# Patient Record
Sex: Male | Born: 2000 | Race: White | Hispanic: Yes | Marital: Single | Smoking: Current some day smoker
Health system: Southern US, Community
[De-identification: ages and names within clinical notes are randomized; demographics above are authoritative.]

## PROBLEM LIST (undated history)

## (undated) DIAGNOSIS — A048 Other specified bacterial intestinal infections: Secondary | ICD-10-CM

## (undated) DIAGNOSIS — J45909 Unspecified asthma, uncomplicated: Secondary | ICD-10-CM

## (undated) HISTORY — PX: FRACTURE SURGERY: SHX138

## (undated) HISTORY — DX: Other specified bacterial intestinal infections: A04.8

## (undated) HISTORY — PX: TONSILLECTOMY: SUR1361

---
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
## 2020-11-07 ENCOUNTER — Encounter (HOSPITAL_COMMUNITY): Payer: Self-pay

## 2020-11-07 ENCOUNTER — Emergency Department (HOSPITAL_COMMUNITY)
Admission: EM | Admit: 2020-11-07 | Discharge: 2020-11-07 | Disposition: A | Discharge status: Home / Self Care | Payer: Federal, State, Local not specified - PPO | Attending: Emergency Medicine | Admitting: Emergency Medicine

## 2020-11-07 ENCOUNTER — Other Ambulatory Visit: Payer: Self-pay

## 2020-11-07 DIAGNOSIS — S59912A Unspecified injury of left forearm, initial encounter: Secondary | ICD-10-CM | POA: Diagnosis present

## 2020-11-07 DIAGNOSIS — J4520 Mild intermittent asthma, uncomplicated: Secondary | ICD-10-CM | POA: Diagnosis not present

## 2020-11-07 DIAGNOSIS — X789XXA Intentional self-harm by unspecified sharp object, initial encounter: Secondary | ICD-10-CM | POA: Insufficient documentation

## 2020-11-07 DIAGNOSIS — F17210 Nicotine dependence, cigarettes, uncomplicated: Secondary | ICD-10-CM | POA: Insufficient documentation

## 2020-11-07 DIAGNOSIS — S51812A Laceration without foreign body of left forearm, initial encounter: Secondary | ICD-10-CM | POA: Insufficient documentation

## 2020-11-07 MED ORDER — BACITRACIN ZINC 500 UNIT/GM EX OINT
TOPICAL_OINTMENT | Freq: Two times a day (BID) | CUTANEOUS | Status: DC
  Administered 2020-11-07: 1 via TOPICAL
  Filled 2020-11-07: qty 0.9

## 2020-11-07 MED ORDER — IBUPROFEN 200 MG PO TABS
400.0000 mg | ORAL_TABLET | Freq: Once | ORAL | Status: AC
  Administered 2020-11-07: 400 mg via ORAL
  Filled 2020-11-07: qty 2

## 2020-11-07 MED ORDER — LIDOCAINE-EPINEPHRINE (PF) 2 %-1:200000 IJ SOLN
10.0000 mL | Freq: Once | INTRAMUSCULAR | Status: AC
  Administered 2020-11-07: 10 mL
  Filled 2020-11-07: qty 20

## 2020-11-07 NOTE — ED Triage Notes (Signed)
Pt states about 1 hour ago he cut his left forearm. Pt reports this is a coping mechanism for him. He currently denies SI or HI. Reports heavy drinking last night.

## 2020-11-07 NOTE — Discharge Instructions (Signed)
Place vasoline on the wound and keep covered for the next 48 hours.  Then only cover if it will become contaminated.

## 2020-11-07 NOTE — ED Provider Notes (Signed)
[facility]-EMERGENCY DEPT Provider Note   CSN: [id number] Arrival date & time: 11/07/20  0813     History Chief Complaint  Patient presents with   Laceration    Jack Aguirre is a 20 y.o. male.  Patient is a 20 year old male with a history of asthma as well has prior stress and history of self-mutilation who is presenting today with a laceration to his left forearm.  Patient reports an the last few months he has had significant increase his stress and has started to cut again.  This occurred while he was in high school and he had a counselor at the time and had been doing well.  However once he turned 16 he can no longer see that counselor and was seeing someone for a while but was not a good fit but in the meantime had gotten healthy and felt like he did not need anybody any longer.  However more recently he has had a lot more stress in his life.  He has been drinking a little more than he feels like he should but denies any drug use.  He takes no medications regularly.  He denies any suicidal ideation and reports this was by no means a way to kill himself.  However he does admit that he cut too deeply today which is the first time that has ever happened.  No numbness or tingling of his hand.  Pain to the site only with palpation.  No other areas of injury.  The history is provided by the patient.  Laceration     Past Medical History:  Diagnosis Date   Asthma    H. 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 pylori infection 11/18/2014   Dyspepsia 11/14/2014    Past Surgical History:  Procedure Laterality Date   FRACTURE SURGERY     TONSILLECTOMY         Family History  Problem Relation Age of Onset   Heart disease Mother    Gestational diabetes Mother    Heart disease Father    Diabetes Maternal Grandmother    Heart disease Maternal Grandfather     Social  History   Tobacco Use   Smoking status: Some Days   Smokeless tobacco: Current  Substance Use Topics   Alcohol use: No   Drug use: No    Home Medications Prior to Admission medications   Medication Sig Start Date End Date Taking? Authorizing Provider  albuterol (PROVENTIL HFA;VENTOLIN HFA) 108 (90 BASE) MCG/ACT inhaler Inhale 2 puffs into the lungs every 6 (six) hours as needed. For shortness of breath or 30 minutes prior to sports activities. 01/30/15   Dettinger, Elige Radon, MD  ibuprofen (ADVIL,MOTRIN) 200 MG tablet Take 200 mg by mouth every 6 (six) hours as needed. For pain    [provider]  oseltamivir (TAMIFLU) 75 MG capsule Take 1 capsule (75 mg total) by mouth 2 (two) times daily. 03/30/16   Elenora Gamma, MD  predniSONE (DELTASONE) 20 MG tablet 2 po at same time daily for 5 days 03/30/16   Elenora Gamma, MD    Allergies    Patient has no known allergies.  Review of Systems   Review of Systems  All other systems reviewed and are negative.  Physical Exam Updated Vital Signs BP (!) 135/103 (BP Location: Right Arm)   Pulse (!) 117   Temp  98.5 F (36.9 C) (Oral)   Resp 19   SpO2 99%   Physical Exam Vitals and nursing note reviewed.  Constitutional:      General: He is not in acute distress.    Appearance: Normal appearance. He is normal weight.  HENT:     Head: Normocephalic.     Mouth/Throat:     Mouth: Mucous membranes are moist.  Eyes:     Pupils: Pupils are equal, round, and reactive to light.  Cardiovascular:     Rate and Rhythm: Normal rate.     Pulses: Normal pulses.  Pulmonary:     Effort: Pulmonary effort is normal.  Musculoskeletal:        General: Signs of injury present.       Arms:     Cervical back: Normal range of motion and neck supple.     Comments:  normal flexion and extension of the left wrist.  Handgrip 5 out of 5 strength.  Normal sensation in all 5 fingers.  Skin:    General: Skin is warm and dry.  Neurological:      Mental Status: He is alert and oriented to person, place, and time. Mental status is at baseline.     Sensory: No sensory deficit.     Motor: No weakness.  Psychiatric:     Comments: Self mutilation and poor coping mechanisms.  Denies any SI or HI.  No hallucinations, delusions or paranoia.    ED Results / Procedures / Treatments   Labs (all labs ordered are listed, but only abnormal results are displayed) Labs Reviewed - No data to display  EKG None  Radiology No results found.  Procedures Procedures  LACERATION REPAIR Performed by: [facility] Authorized by: Gwyneth Sprout Consent: Verbal consent obtained. Risks and benefits: risks, benefits and alternatives were discussed Consent given by: patient Patient identity confirmed: provided demographic data Prepped and Draped in normal sterile fashion Wound explored  Laceration Location: left forearm  Laceration Length: 6cm  No Foreign Bodies seen or palpated  Anesthesia: local infiltration  Local anesthetic: lidocaine 2% with epinephrine  Anesthetic total: 9 ml  Irrigation method: syringe Amount of cleaning: standard  Skin closure: 4.0 vicryl rapide  Number of sutures: 24  Technique: running and simple interrupted  Patient tolerance: Patient tolerated the procedure well with no immediate complications.   Medications Ordered in ED Medications  lidocaine-EPINEPHrine (XYLOCAINE W/EPI) 2 %-1:200000 (PF) injection 10 mL (10 mLs Infiltration Given 11/07/20 4332)    ED Course  I have reviewed the triage vital signs and the nursing notes.  Pertinent labs & imaging results that were available during my care of the patient were reviewed by me and considered in my medical decision making (see chart for details).    MDM Rules/Calculators/A&P                           Patient presenting today after a self-inflicted laceration to the left wrist.  Injury repaired as above.  Tetanus shot is up-to-date at this  time.  Patient recognizes that he cut too deep and this was not a desire to kill himself but just to relieve some stress and anxiety he has been experiencing as he is under a lot more stress recently.  This occurred approximately 1 hour prior to arrival.  Patient has had counselors in the past but has not seen anyone for a while.  He is interested in outpatient resources.  At  this time do not feel that patient is suicidal.  No evidence of tendon injury.  Final Clinical Impression(s) / ED Diagnoses Final diagnoses:  Laceration of left forearm, initial encounter    Rx / DC Orders ED Discharge Orders     None        Gwyneth Sprout, MD 11/07/20 [phone]

## 2023-01-10 ENCOUNTER — Ambulatory Visit: Payer: Federal, State, Local not specified - PPO

## 2023-01-11 ENCOUNTER — Ambulatory Visit: Payer: Federal, State, Local not specified - PPO | Admitting: Nurse Practitioner

## 2023-01-11 NOTE — Progress Notes (Deleted)
   New Patient Office Visit  Subjective    Patient ID: Jack Aguirre, male    DOB: January 03, 2001  Age: 22 y.o. MRN: [id number]  CC: No chief complaint on file.   HPI Jack Aguirre presents to establish care ***  Outpatient Encounter Medications as of 01/11/2023  Medication Sig   albuterol (PROVENTIL HFA;VENTOLIN HFA) 108 (90 BASE) MCG/ACT inhaler Inhale 2 puffs into the lungs every 6 (six) hours as needed. For shortness of breath or 30 minutes prior to sports activities.   ibuprofen (ADVIL,MOTRIN) 200 MG tablet Take 200 mg by mouth every 6 (six) hours as needed. For pain   oseltamivir (TAMIFLU) 75 MG capsule Take 1 capsule (75 mg total) by mouth 2 (two) times daily.   predniSONE (DELTASONE) 20 MG tablet 2 po at same time daily for 5 days   No facility-administered encounter medications on file as of 01/11/2023.    Past Medical History:  Diagnosis Date   Asthma    H. pylori infection     Past Surgical History:  Procedure Laterality Date   FRACTURE SURGERY     TONSILLECTOMY      Family History  Problem Relation Age of Onset   Heart disease Mother    Gestational diabetes Mother    Heart disease Father    Diabetes Maternal Grandmother    Heart disease Maternal Grandfather     Social History   Socioeconomic History   Marital status: Single    Spouse name: Not on file   Number of children: Not on file   Years of education: Not on file   Highest education level: Not on file  Occupational History   Not on file  Tobacco Use   Smoking status: Some Days   Smokeless tobacco: Current  Substance and Sexual Activity   Alcohol use: No   Drug use: No   Sexual activity: Not on file  Other Topics Concern   Not on file  Social History Narrative   ** Merged History Encounter **       Social Determinants of Health   Financial Resource Strain: Not on file  Food Insecurity: Not on file  Transportation Needs: Not on file  Physical Activity: Not on  file  Stress: Not on file  Social Connections: Not on file  Intimate Partner Violence: Not on file    ROS Negative unless indicated in HPI   Objective    There were no vitals taken for this visit.  Physical Exam        Assessment & Plan:  Routine medical exam  Continue healthy lifestyle choices, including diet (rich in fruits, vegetables, and lean proteins, and low in salt and simple carbohydrates) and exercise (at least 30 minutes of moderate physical activity daily).     The above assessment and management plan was discussed with the patient. The patient verbalized understanding of and has agreed to the management plan. Patient is aware to call the clinic if they develop any new symptoms or if symptoms persist or worsen. Patient is aware when to return to the clinic for a follow-up visit. Patient educated on when it is appropriate to go to the emergency department.   No follow-ups on file.   Arrie Aran Santa Lighter, DNP [facility] [address] [place], [place] [address] [phone]

## 2024-01-10 ENCOUNTER — Encounter: Payer: Self-pay | Admitting: Family Medicine

## 2024-01-10 ENCOUNTER — Ambulatory Visit: Payer: Self-pay | Admitting: Family Medicine

## 2024-01-10 VITALS — BP 129/83 | HR 96 | Temp 97.8°F | Ht 68.0 in | Wt 201.2 lb

## 2024-01-10 DIAGNOSIS — J45990 Exercise induced bronchospasm: Secondary | ICD-10-CM | POA: Diagnosis not present

## 2024-01-10 DIAGNOSIS — R41840 Attention and concentration deficit: Secondary | ICD-10-CM | POA: Diagnosis not present

## 2024-01-10 DIAGNOSIS — F411 Generalized anxiety disorder: Secondary | ICD-10-CM

## 2024-01-10 DIAGNOSIS — F331 Major depressive disorder, recurrent, moderate: Secondary | ICD-10-CM

## 2024-01-10 MED ORDER — SERTRALINE HCL 50 MG PO TABS: 50.0000 mg | ORAL_TABLET | Freq: Every morning | ORAL | 2 refills | Status: DC

## 2024-01-10 MED ORDER — SERTRALINE HCL 50 MG PO TABS: 75.0000 mg | ORAL_TABLET | Freq: Every morning | ORAL | 1 refills | Status: AC

## 2024-01-10 MED ORDER — ALBUTEROL SULFATE HFA 108 (90 BASE) MCG/ACT IN AERS: 2.0000 | INHALATION_SPRAY | Freq: Four times a day (QID) | RESPIRATORY_TRACT | 11 refills | Status: AC | PRN

## 2024-01-10 NOTE — Progress Notes (Signed)
 Subjective:  Patient ID: Jack Aguirre, male    DOB: 2000/03/29, 23 y.o.   MRN: [id number]  Patient Care Team: Severa Rock HERO, FNP as PCP - General (Family Medicine) Harl Jayson CROME, MD (Inactive) (Family Medicine)   Chief Complaint:  Establish Care   HPI: Jack Aguirre is a 22 y.o. male presenting on 01/10/2024 for Establish Care   Jack Aguirre is a 23 year old male who presents to establish care and manage anxiety and depression.  Mood disturbance and anxiety - Longstanding symptoms of anxiety and depression, initially treated with Vistaril and Prozac prior to high school (approximately ten years ago) - Restarted pharmacologic treatment in July after resuming therapy; currently taking Zoloft for mixed anxiety and depressive symptoms - Attends therapy sessions every two weeks - Current low dose of Zoloft provides adequate symptom control  Attention deficit symptoms - History of attention deficit symptoms since childhood, diagnosed with ADHD in the past - Brief trial of Wellbutrin, discontinued due to lack of efficacy and adverse effects - Attention deficits worsened during college, particularly with remote learning during the COVID-19 pandemic - 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, finds these tasks challenging due to attention issues  Exercise-induced dyspnea and asthma - Experiences significant shortness of breath and sweating during physical exertion, especially during sexual intercourse - Symptoms typically resolve within five to ten minutes, usually after use of an inhaler - No personal inhaler currently available; seeking prescription for one  Laboratory findings - Comprehensive blood workup at [facility] outpatient rehab center was normal except for a non-fasting glucose level - Uncertain if hormone profile was included in prior laboratory evaluation         01/10/2024    2:36 PM 03/30/2016     8:44 AM 08/29/2015    3:20 PM  Depression screen PHQ 2/9  Decreased Interest 0 0 0  Down, Depressed, Hopeless 0 0 0  PHQ - 2 Score 0 0 0  Altered sleeping 0 0   Tired, decreased energy 1 0   Change in appetite 0 0   Feeling bad or failure about yourself  0 0   Trouble concentrating 0 0   Moving slowly or fidgety/restless 0 0   Suicidal thoughts 0 0    PHQ-9 Score 1 0    Difficult doing work/chores Somewhat difficult       Data saved with a previous flowsheet row definition      01/10/2024    2:36 PM  GAD 7 : Generalized Anxiety Score  Nervous, Anxious, on Edge 0  Control/stop worrying 0  Worry too much - different things 0  Trouble relaxing 0  Restless 0  Easily annoyed or irritable 0  Afraid - awful might happen 0  Total GAD 7 Score 0  Anxiety Difficulty Not difficult at all        Relevant past medical, surgical, family, and social history reviewed and updated as indicated.  Allergies and medications reviewed and updated. Data reviewed: Chart in Epic.   Past Medical History:  Diagnosis Date   Asthma    H. pylori infection     Past Surgical History:  Procedure Laterality Date   FRACTURE SURGERY     nose x2   TONSILLECTOMY      Social History   Socioeconomic History   Marital status: Single    Spouse name: Not on file   Number of children: Not on file   Years  of education: Not on file   Highest education level: Not on file  Occupational History   Not on file  Tobacco Use   Smoking status: Some Days    Types: Cigars   Smokeless tobacco: Never  Vaping Use   Vaping status: Every Day   Substances: Nicotine  Substance and Sexual Activity   Alcohol use: Yes    Comment: socially   Drug use: No   Sexual activity: Not on file  Other Topics Concern   Not on file  Social History Narrative   ** Merged History Encounter **       Social Drivers of Health   Financial Resource Strain: Not on file  Food Insecurity: Not on file  Transportation Needs:  Not on file  Physical Activity: Not on file  Stress: Not on file  Social Connections: Not on file  Intimate Partner Violence: Not on file    Outpatient Encounter Medications as of 01/10/2024  Medication Sig   [DISCONTINUED] albuterol  (PROVENTIL  HFA;VENTOLIN  HFA) 108 (90 BASE) MCG/ACT inhaler Inhale 2 puffs into the lungs every 6 (six) hours as needed. For shortness of breath or 30 minutes prior to sports activities.   [DISCONTINUED] buPROPion (WELLBUTRIN XL) 150 MG 24 hr tablet Take 150 mg by mouth every morning.   [DISCONTINUED] sertraline (ZOLOFT) 50 MG tablet Take 50 mg by mouth every morning.   albuterol  (VENTOLIN  HFA) 108 (90 Base) MCG/ACT inhaler Inhale 2 puffs into the lungs every 6 (six) hours as needed. For shortness of breath or 30 minutes prior to sports activities.   sertraline (ZOLOFT) 50 MG tablet Take 1.5 tablets (75 mg total) by mouth every morning.   [DISCONTINUED] ibuprofen  (ADVIL ,MOTRIN ) 200 MG tablet Take 200 mg by mouth every 6 (six) hours as needed. For pain   [DISCONTINUED] oseltamivir  (TAMIFLU ) 75 MG capsule Take 1 capsule (75 mg total) by mouth 2 (two) times daily.   [DISCONTINUED] predniSONE  (DELTASONE ) 20 MG tablet 2 po at same time daily for 5 days   [DISCONTINUED] sertraline (ZOLOFT) 50 MG tablet Take 1 tablet (50 mg total) by mouth every morning.   No facility-administered encounter medications on file as of 01/10/2024.    No Known Allergies  Pertinent ROS per HPI, otherwise unremarkable      Objective:  BP 129/83   Pulse 96   Temp 97.8 F (36.6 C)   Ht 5' 8 (1.727 m)   Wt 201 lb 3.2 oz (91.3 kg)   SpO2 97%   BMI 30.59 kg/m    Wt Readings from Last 3 Encounters:  01/10/24 201 lb 3.2 oz (91.3 kg)  03/30/16 184 lb 6.4 oz (83.6 kg) (89%, Z= 1.25)*  08/29/15 185 lb 12.8 oz (84.3 kg) (92%, Z= 1.39)*   * Growth percentiles are based on CDC (Boys, 2-20 Years) data.    Physical Exam Vitals and nursing note reviewed.  Constitutional:       General: He is not in acute distress.    Appearance: Normal appearance. He is well-developed and well-groomed. He is obese. He is not ill-appearing, toxic-appearing or diaphoretic.  HENT:     Head: Normocephalic and atraumatic.     Jaw: There is normal jaw occlusion.     Right Ear: Hearing normal.     Left Ear: Hearing normal.     Nose: Nose normal.     Mouth/Throat:     Lips: Pink.     Mouth: Mucous membranes are moist.     Pharynx: Oropharynx is clear. Uvula  midline.  Eyes:     General: Lids are normal.     Extraocular Movements: Extraocular movements intact.     Conjunctiva/sclera: Conjunctivae normal.     Pupils: Pupils are equal, round, and reactive to light.  Neck:     Thyroid: No thyroid mass, thyromegaly or thyroid tenderness.     Vascular: No carotid bruit or JVD.     Trachea: Trachea and phonation normal.  Cardiovascular:     Rate and Rhythm: Normal rate and regular rhythm.     Chest Wall: PMI is not displaced.     Pulses: Normal pulses.     Heart sounds: Normal heart sounds. No murmur heard.    No friction rub. No gallop.  Pulmonary:     Effort: Pulmonary effort is normal. No respiratory distress.     Breath sounds: Normal breath sounds. No wheezing.  Abdominal:     General: Bowel sounds are normal. There is no distension or abdominal bruit.     Palpations: Abdomen is soft. There is no hepatomegaly or splenomegaly.     Tenderness: There is no abdominal tenderness. There is no right CVA tenderness or left CVA tenderness.     Hernia: No hernia is present.  Musculoskeletal:        General: Normal range of motion.     Cervical back: Normal range of motion and neck supple.     Right lower leg: No edema.     Left lower leg: No edema.  Lymphadenopathy:     Cervical: No cervical adenopathy.  Skin:    General: Skin is warm and dry.     Capillary Refill: Capillary refill takes less than 2 seconds.     Coloration: Skin is not cyanotic, jaundiced or pale.     Findings: No  rash.  Neurological:     General: No focal deficit present.     Mental Status: He is alert and oriented to person, place, and time.     Sensory: Sensation is intact.     Motor: Motor function is intact.     Coordination: Coordination is intact.     Gait: Gait is intact.     Deep Tendon Reflexes: Reflexes are normal and symmetric.  Psychiatric:        Attention and Perception: Attention and perception normal.        Mood and Affect: Mood and affect normal.        Speech: Speech normal.        Behavior: Behavior normal. Behavior is cooperative.        Thought Content: Thought content normal.        Cognition and Memory: Cognition and memory normal.        Judgment: Judgment normal.      Results for orders placed or performed in visit on 11/14/14  H Pylori, IGM, IGG, IGA AB   Collection Time: 11/14/14  3:01 PM  Result Value Ref Range   H Pylori IgG 3.2 (H) 0.0 - 0.8 U/mL   H. pylori, IgA Abs <9.0 0.0 - 8.9 units   H pylori, IgM Abs <9.0 0.0 - 8.9 units       Pertinent labs & imaging results that were available during my care of the patient were reviewed by me and considered in my medical decision making.  Assessment & Plan:  Kimoni was seen today for establish care.  Diagnoses and all orders for this visit:  Moderate recurrent major depression (HCC) -  Discontinue: sertraline (ZOLOFT) 50 MG tablet; Take 1 tablet (50 mg total) by mouth every morning. -     Ambulatory referral to Psychiatry -     sertraline (ZOLOFT) 50 MG tablet; Take 1.5 tablets (75 mg total) by mouth every morning.  Generalized anxiety disorder -     Discontinue: sertraline (ZOLOFT) 50 MG tablet; Take 1 tablet (50 mg total) by mouth every morning. -     Ambulatory referral to Psychiatry -     sertraline (ZOLOFT) 50 MG tablet; Take 1.5 tablets (75 mg total) by mouth every morning.  Mild exercise-induced asthma -     albuterol  (VENTOLIN  HFA) 108 (90 Base) MCG/ACT inhaler; Inhale 2 puffs into the lungs  every 6 (six) hours as needed. For shortness of breath or 30 minutes prior to sports activities.  Attention and concentration deficit -     Ambulatory referral to Psychiatry     Depression and generalized anxiety disorder Well-managed with Zoloft since July. No significant side effects reported. Therapy sessions occur every two weeks. Previous blood work at [facility] showed normal results except for glucose, which was attributed to non-fasting state. No recent thyroid panel available for review. - Increased Zoloft to 75 mg daily by taking one and a half tablets. - Will obtain previous blood work from [facility] for review. - Continue therapy sessions every two weeks.  Exercise induced bronchospasm Symptoms occur during sexual intercourse, characterized by shortness of breath and profuse sweating. Symptoms resolve within 5-10 minutes post-exertion. No personal inhaler currently used. Symptoms suggestive of exercise induced asthma. - Prescribed albuterol  inhaler. - Instructed to use albuterol  inhaler prior to exertion, including sexual activity. - Monitor symptoms and report if inhaler use does not alleviate symptoms.  Attention and concentration deficit (possible adult ADHD) Attention and concentration deficits present since childhood, worsening with age. Previous trial of Wellbutrin was ineffective and caused adverse effects. No recent psychiatric evaluation for adult ADHD. Symptoms may be related to undertreated anxiety or depression. - Referred to psychiatry for evaluation and management of possible adult ADHD. - Continue to monitor symptoms and consider increasing Zoloft if anxiety or depression symptoms persist.  General Health Maintenance Routine health maintenance discussed, including the need for a complete physical examination and review of previous lab results. - Will schedule complete physical examination in the first quarter of the year. - Will review previous lab  results from [facility].          Continue all other maintenance medications.  Follow up plan: Return in about 3 months (around 04/11/2024), or if symptoms worsen or fail to improve, for Annual Physical.   Continue healthy lifestyle choices, including diet (rich in fruits, vegetables, and lean proteins, and low in salt and simple carbohydrates) and exercise (at least 30 minutes of moderate physical activity daily).  Educational handout given for managing depression   The above assessment and management plan was discussed with the patient. The patient verbalized understanding of and has agreed to the management plan. Patient is aware to call the clinic if they develop any new symptoms or if symptoms persist or worsen. Patient is aware when to return to the clinic for a follow-up visit. Patient educated on when it is appropriate to go to the emergency department.   Rosaline Bruns, FNP-C [facility] [phone]

## 2024-01-31 ENCOUNTER — Other Ambulatory Visit: Payer: Self-pay | Admitting: Family Medicine

## 2024-01-31 DIAGNOSIS — F331 Major depressive disorder, recurrent, moderate: Secondary | ICD-10-CM

## 2024-01-31 DIAGNOSIS — F411 Generalized anxiety disorder: Secondary | ICD-10-CM

## 2024-01-31 DIAGNOSIS — J45990 Exercise induced bronchospasm: Secondary | ICD-10-CM

## 2024-01-31 NOTE — Telephone Encounter (Signed)
 Copied from CRM #[id number]. Topic: Clinical - Medication Refill >> Jan 31, 2024  8:31 AM Cherylann S wrote: Medication:  albuterol  (VENTOLIN  HFA) 108 (90 Base) MCG/ACT inhaler  sertraline  (ZOLOFT ) 50 MG tablet  Has the patient contacted their pharmacy? Yes (Agent: If no, request that the patient contact the pharmacy for the refill. If patient does not wish to contact the pharmacy document the reason why and proceed with request.) (Agent: If yes, when and what did the pharmacy advise?)  This is the patient's preferred pharmacy:  [id number] [address], [place] - [address] AT [facility] [address] [place] [address] Phone: [phone] Fax: [phone]  Is this the correct pharmacy for this prescription? Yes If no, delete pharmacy and type the correct one.   Has the prescription been filled recently? No  Is the patient out of the medication? Yes  Has the patient been seen for an appointment in the last year OR does the patient have an upcoming appointment? Yes  Can we respond through MyChart? Yes  Agent: Please be advised that Rx refills may take up to 3 business days. We ask that you follow-up with your pharmacy.

## 2024-04-26 ENCOUNTER — Encounter: Admitting: Family Medicine
